# Patient Record
Sex: Female | Born: 1995 | Race: Black or African American | Hispanic: No | Marital: Single | Smoking: Former smoker
Health system: Southern US, Community
[De-identification: ages and names within clinical notes are randomized; demographics above are authoritative.]

---
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
## 2015-05-25 ENCOUNTER — Encounter (HOSPITAL_COMMUNITY): Payer: Self-pay | Admitting: *Deleted

## 2015-05-25 ENCOUNTER — Emergency Department (HOSPITAL_COMMUNITY)
Admission: EM | Admit: 2015-05-25 | Discharge: 2015-05-25 | Disposition: A | Discharge status: Home / Self Care | Payer: Medicaid Other | Attending: Emergency Medicine | Admitting: Emergency Medicine

## 2015-05-25 DIAGNOSIS — R112 Nausea with vomiting, unspecified: Secondary | ICD-10-CM

## 2015-05-25 DIAGNOSIS — Z3202 Encounter for pregnancy test, result negative: Secondary | ICD-10-CM | POA: Insufficient documentation

## 2015-05-25 DIAGNOSIS — R1012 Left upper quadrant pain: Secondary | ICD-10-CM | POA: Diagnosis not present

## 2015-05-25 LAB — I-STAT CHEM 8, ED
BUN: 14 mg/dL (ref 6–20)
CHLORIDE: 99 mmol/L — AB (ref 101–111)
CREATININE: 0.7 mg/dL (ref 0.44–1.00)
Calcium, Ion: 1.19 mmol/L (ref 1.12–1.23)
GLUCOSE: 101 mg/dL — AB (ref 65–99)
HCT: 42 % (ref 36.0–46.0)
Hemoglobin: 14.3 g/dL (ref 12.0–15.0)
POTASSIUM: 4.3 mmol/L (ref 3.5–5.1)
Sodium: 137 mmol/L (ref 135–145)
TCO2: 27 mmol/L (ref 0–100)

## 2015-05-25 LAB — I-STAT BETA HCG BLOOD, ED (MC, WL, AP ONLY)

## 2015-05-25 MED ORDER — ONDANSETRON HCL 8 MG PO TABS: 8.0000 mg | ORAL_TABLET | Freq: Three times a day (TID) | ORAL | Status: DC | PRN

## 2015-05-25 MED ORDER — SODIUM CHLORIDE 0.9 % IV SOLN: INTRAVENOUS | Status: DC

## 2015-05-25 MED ORDER — SODIUM CHLORIDE 0.9 % IV BOLUS (SEPSIS)
1000.0000 mL | Freq: Once | INTRAVENOUS | Status: AC
  Administered 2015-05-25: 1000 mL via INTRAVENOUS

## 2015-05-25 NOTE — ED Notes (Signed)
Patient given crackers and sprite  

## 2015-05-25 NOTE — ED Provider Notes (Signed)
CSN: [id number]     Arrival date & time 05/25/15  1004 History   First MD Initiated Contact with Patient 05/25/15 1011     Chief Complaint  Patient presents with  . Emesis     (Consider location/radiation/quality/duration/timing/severity/associated sxs/prior Treatment) HPI   Brittany Norton is a 19 y.o. female who presents for evaluation of nausea and vomiting which started yesterday. No hematemesis. No diarrhea. Last menstrual period one week ago. No nausea, contacts, or abnormal food ingestions. There are no other known modifying factors.    History reviewed. No pertinent past medical history. History reviewed. No pertinent past surgical history. Family History  Problem Relation Age of Onset  . Anxiety disorder Mother   . Depression Mother   . Diabetes Mother   . Hypertension Mother   . Hypertension Father    Social History  Substance Use Topics  . Smoking status: Never Smoker   . Smokeless tobacco: None  . Alcohol Use: No   OB History    No data available     Review of Systems  All other systems reviewed and are negative.     Allergies  Peanut-containing drug products  Home Medications   Prior to Admission medications   Medication Sig Start Date End Date Taking? Authorizing Provider  ondansetron (ZOFRAN) 8 MG tablet Take 1 tablet (8 mg total) by mouth every 8 (eight) hours as needed for nausea or vomiting. 05/25/15   Mancel Bale, MD   BP 115/68 mmHg  Pulse 97  Temp(Src) 99.6 F (37.6 C) (Oral)  Resp 18  SpO2 100%  LMP 05/17/2015 Physical Exam  Constitutional: She is oriented to person, place, and time. She appears well-developed and well-nourished. No distress.  HENT:  Head: Normocephalic and atraumatic.  Right Ear: External ear normal.  Left Ear: External ear normal.  Eyes: Conjunctivae and EOM are normal. Pupils are equal, round, and reactive to light.  Neck: Normal range of motion and phonation normal. Neck supple.  Cardiovascular: Normal rate,  regular rhythm and normal heart sounds.   Pulmonary/Chest: Effort normal and breath sounds normal. She exhibits no bony tenderness.  Abdominal: Soft. There is tenderness (Left upper quadrant, mild). There is no rebound and no guarding.  Musculoskeletal: Normal range of motion.  Neurological: She is alert and oriented to person, place, and time. No cranial nerve deficit or sensory deficit. She exhibits normal muscle tone. Coordination normal.  Skin: Skin is warm, dry and intact.  Psychiatric: She has a normal mood and affect. Her behavior is normal. Judgment and thought content normal.  Nursing note and vitals reviewed.   ED Course  Procedures (including critical care time)  Medications  0.9 %  sodium chloride infusion (not administered)  sodium chloride 0.9 % bolus 1,000 mL (0 mLs Intravenous Stopped 05/25/15 1153)    Patient Vitals for the past 24 hrs:  BP Temp Temp src Pulse Resp SpO2  05/25/15 1145 115/68 mmHg - - 97 18 100 %  05/25/15 1008 118/79 mmHg 99.6 F (37.6 C) Oral (!) 122 18 97 %    12:36 PM Reevaluation with update and discussion. After initial assessment and treatment, an updated evaluation reveals she feels better. No vomiting in the emergency department. Findings discussed with patient and mother, all questions were answered. Brittany Norton    Labs Review Labs Reviewed  I-STAT CHEM 8, ED - Abnormal; Notable for the following:    Chloride 99 (*)    Glucose, Bld 101 (*)    All other components  within normal limits  I-STAT BETA HCG BLOOD, ED ([facility], [facility], [facility] ONLY)    Imaging Review No results found. I have personally reviewed and evaluated these images and lab results as part of my medical decision-making.   EKG Interpretation None      MDM   Final diagnoses:  Nausea and vomiting, vomiting of unspecified type    Nonspecific, nausea and vomiting. Differential diagnosis includes viral illness, and food poisoning. Doubt serous but you'll infection, metabolic  instability or impending vascular collapse.  Nursing Notes Reviewed/ Care Coordinated Applicable Imaging Reviewed Interpretation of Laboratory Data incorporated into ED treatment  The patient appears reasonably screened and/or stabilized for discharge and I doubt any other medical condition or other [facility] requiring further screening, evaluation, or treatment in the ED at this time prior to discharge.  Plan: Home Medications- Zofran; Home Treatments- rest, clear liquids and advance slowly; return here if the recommended treatment, does not improve the symptoms; Recommended follow up- PCP prn     Mancel Bale, MD 05/25/15 1237

## 2015-05-25 NOTE — ED Notes (Signed)
Patient tolerating po's without n&v.

## 2015-05-25 NOTE — Discharge Instructions (Signed)
Nausea and Vomiting °Nausea is a sick feeling that often comes before throwing up (vomiting). Vomiting is a reflex where stomach contents come out of your mouth. Vomiting can cause severe loss of body fluids (dehydration). Children and elderly adults can become dehydrated quickly, especially if they also have diarrhea. Nausea and vomiting are symptoms of a condition or disease. It is important to find the cause of your symptoms. °CAUSES  °· Direct irritation of the stomach lining. This irritation can result from increased acid production (gastroesophageal reflux disease), infection, food poisoning, taking certain medicines (such as nonsteroidal anti-inflammatory drugs), alcohol use, or tobacco use. °· Signals from the brain. These signals could be caused by a headache, heat exposure, an inner ear disturbance, increased pressure in the brain from injury, infection, a tumor, or a concussion, pain, emotional stimulus, or metabolic problems. °· An obstruction in the gastrointestinal tract (bowel obstruction). °· Illnesses such as diabetes, hepatitis, gallbladder problems, appendicitis, kidney problems, cancer, sepsis, atypical symptoms of a heart attack, or eating disorders. °· Medical treatments such as chemotherapy and radiation. °· Receiving medicine that makes you sleep (general anesthetic) during surgery. °DIAGNOSIS °Your caregiver may ask for tests to be done if the problems do not improve after a few days. Tests may also be done if symptoms are severe or if the reason for the nausea and vomiting is not clear. Tests may include: °· Urine tests. °· Blood tests. °· Stool tests. °· Cultures (to look for evidence of infection). °· X-rays or other imaging studies. °Test results can help your caregiver make decisions about treatment or the need for additional tests. °TREATMENT °You need to stay well hydrated. Drink frequently but in small amounts. You may wish to drink water, sports drinks, clear broth, or eat frozen  ice pops or gelatin dessert to help stay hydrated. When you eat, eating slowly may help prevent nausea. There are also some antinausea medicines that may help prevent nausea. °HOME CARE INSTRUCTIONS  °· Take all medicine as directed by your caregiver. °· If you do not have an appetite, do not force yourself to eat. However, you must continue to drink fluids. °· If you have an appetite, eat a normal diet unless your caregiver tells you differently. °¨ Eat a variety of complex carbohydrates (rice, wheat, potatoes, bread), lean meats, yogurt, fruits, and vegetables. °¨ Avoid high-fat foods because they are more difficult to digest. °· Drink enough water and fluids to keep your urine clear or pale yellow. °· If you are dehydrated, ask your caregiver for specific rehydration instructions. Signs of dehydration may include: °¨ Severe thirst. °¨ Dry lips and mouth. °¨ Dizziness. °¨ Dark urine. °¨ Decreasing urine frequency and amount. °¨ Confusion. °¨ Rapid breathing or pulse. °SEEK IMMEDIATE MEDICAL CARE IF:  °· You have blood or brown flecks (like coffee grounds) in your vomit. °· You have black or bloody stools. °· You have a severe headache or stiff neck. °· You are confused. °· You have severe abdominal pain. °· You have chest pain or trouble breathing. °· You do not urinate at least once every 8 hours. °· You develop cold or clammy skin. °· You continue to vomit for longer than 24 to 48 hours. °· You have a fever. °MAKE SURE YOU:  °· Understand these instructions. °· Will watch your condition. °· Will get help right away if you are not doing well or get worse. °  °This information is not intended to replace advice given to you by your health care provider. Make sure   you discuss any questions you have with your health care provider.   Document Released: 08/02/2005 Document Revised: 10/25/2011 Document Reviewed: 12/30/2010 Elsevier Interactive Patient Education 2016 Elsevier Inc. Clear Liquid Diet A clear liquid  diet is a short-term diet that is prescribed to provide the necessary fluid and basic energy you need when you can have nothing else. The clear liquid diet consists of liquids or solids that will become liquid at room temperature. You should be able to see through the liquid. There are many reasons that you may be restricted to clear liquids, such as:  When you have a sudden-onset (acute) condition that occurs before or after surgery.  To help your body slowly get adjusted to food again after a long period when you were unable to have food.  Replacement of fluids when you have a diarrheal disease.  When you are going to have certain exams, such as a colonoscopy, in which instruments are inserted inside your body to look at parts of your digestive system. WHAT CAN I HAVE? A clear liquid diet does not provide all the nutrients you need. It is important to choose a variety of the following items to get as many nutrients as possible:  Vegetable juices that do not have pulp.  Fruit juices and fruit drinks that do not have pulp.  Coffee (regular or decaffeinated), tea, or soda at the discretion of your health care provider.  Clear bouillon, broth, or strained broth-based soups.  High-protein and flavored gelatins.  Sugar or honey.  Ices or frozen ice pops that do not contain milk. If you are not sure whether you can have certain items, you should ask your health care provider. You may also ask your health care provider if there are any other clear liquid options.   This information is not intended to replace advice given to you by your health care provider. Make sure you discuss any questions you have with your health care provider.   Document Released: 08/02/2005 Document Revised: 08/07/2013 Document Reviewed: 06/29/2013 Elsevier Interactive Patient Education 2016 [employer]. Food Choices to Help Relieve Diarrhea, Adult When you have diarrhea, the foods you eat and your eating habits  are very important. Choosing the right foods and drinks can help relieve diarrhea. Also, because diarrhea can last up to 7 days, you need to replace lost fluids and electrolytes (such as sodium, potassium, and chloride) in order to help prevent dehydration.  WHAT GENERAL GUIDELINES DO I NEED TO FOLLOW?  Slowly drink 1 cup (8 oz) of fluid for each episode of diarrhea. If you are getting enough fluid, your urine will be clear or pale yellow.  Eat starchy foods. Some good choices include white rice, white toast, pasta, low-fiber cereal, baked potatoes (without the skin), saltine crackers, and bagels.  Avoid large servings of any cooked vegetables.  Limit fruit to two servings per day. A serving is  cup or 1 small piece.  Choose foods with less than 2 g of fiber per serving.  Limit fats to less than 8 tsp (38 g) per day.  Avoid fried foods.  Eat foods that have probiotics in them. Probiotics can be found in certain dairy products.  Avoid foods and beverages that may increase the speed at which food moves through the stomach and intestines (gastrointestinal tract). Things to avoid include:  High-fiber foods, such as dried fruit, raw fruits and vegetables, nuts, seeds, and whole grain foods.  Spicy foods and high-fat foods.  Foods and beverages sweetened with  high-fructose corn syrup, honey, or sugar alcohols such as xylitol, sorbitol, and mannitol. WHAT FOODS ARE RECOMMENDED? Grains White rice. White, [place], or pita breads (fresh or toasted), including plain rolls, buns, or bagels. White pasta. Saltine, soda, or graham crackers. Pretzels. Low-fiber cereal. Cooked cereals made with water (such as cornmeal, farina, or cream cereals). Plain muffins. Matzo. Melba toast. Zwieback.  Vegetables Potatoes (without the skin). Strained tomato and vegetable juices. Most well-cooked and canned vegetables without seeds. Tender lettuce. Fruits Cooked or canned applesauce, apricots, cherries, fruit  cocktail, grapefruit, peaches, pears, or plums. Fresh bananas, apples without skin, cherries, grapes, cantaloupe, grapefruit, peaches, oranges, or plums.  Meat and Other Protein Products Baked or boiled chicken. Eggs. Tofu. Fish. Seafood. Smooth peanut butter. Ground or well-cooked tender beef, ham, veal, lamb, pork, or poultry.  Dairy Plain yogurt, kefir, and unsweetened liquid yogurt. Lactose-free milk, buttermilk, or soy milk. Plain hard cheese. Beverages Sport drinks. Clear broths. Diluted fruit juices (except prune). Regular, caffeine-free sodas such as ginger ale. Water. Decaffeinated teas. Oral rehydration solutions. Sugar-free beverages not sweetened with sugar alcohols. Other Bouillon, broth, or soups made from recommended foods.  The items listed above may not be a complete list of recommended foods or beverages. Contact your dietitian for more options. WHAT FOODS ARE NOT RECOMMENDED? Grains Whole grain, whole wheat, bran, or rye breads, rolls, pastas, crackers, and cereals. Wild or brown rice. Cereals that contain more than 2 g of fiber per serving. Corn tortillas or taco shells. Cooked or dry oatmeal. Granola. Popcorn. Vegetables Raw vegetables. Cabbage, broccoli, Brussels sprouts, artichokes, baked beans, beet greens, corn, kale, legumes, peas, sweet potatoes, and yams. Potato skins. Cooked spinach and cabbage. Fruits Dried fruit, including raisins and dates. Raw fruits. Stewed or dried prunes. Fresh apples with skin, apricots, mangoes, pears, raspberries, and strawberries.  Meat and Other Protein Products Chunky peanut butter. Nuts and seeds. Beans and lentils. Tomasa Blase.  Dairy High-fat cheeses. Milk, chocolate milk, and beverages made with milk, such as milk shakes. Cream. Ice cream. Sweets and Desserts Sweet rolls, doughnuts, and sweet breads. Pancakes and waffles. Fats and Oils Butter. Cream sauces. Margarine. Salad oils. Plain salad dressings. Olives. Avocados.   Beverages Caffeinated beverages (such as coffee, tea, soda, or energy drinks). Alcoholic beverages. Fruit juices with pulp. Prune juice. Soft drinks sweetened with high-fructose corn syrup or sugar alcohols. Other Coconut. Hot sauce. Chili powder. Mayonnaise. Gravy. Cream-based or milk-based soups.  The items listed above may not be a complete list of foods and beverages to avoid. Contact your dietitian for more information. WHAT SHOULD I DO IF I BECOME DEHYDRATED? Diarrhea can sometimes lead to dehydration. Signs of dehydration include dark urine and dry mouth and skin. If you think you are dehydrated, you should rehydrate with an oral rehydration solution. These solutions can be purchased at pharmacies, retail stores, or online.  Drink -1 cup (120-240 mL) of oral rehydration solution each time you have an episode of diarrhea. If drinking this amount makes your diarrhea worse, try drinking smaller amounts more often. For example, drink 1-3 tsp (5-15 mL) every 5-10 minutes.  A general rule for staying hydrated is to drink 1-2 L of fluid per day. Talk to your health care provider about the specific amount you should be drinking each day. Drink enough fluids to keep your urine clear or pale yellow.   This information is not intended to replace advice given to you by your health care provider. Make sure you discuss any questions you have with  your health care provider.   Document Released: 10/23/2003 Document Revised: 08/23/2014 Document Reviewed: 06/25/2013 Elsevier Interactive Patient Education [employer]

## 2015-05-25 NOTE — ED Notes (Signed)
Pt began having emesis yesterday.  Has had emesis 6-7x per mom.  She possibly has had a mild fever and does have a non-productive cough, but denies  Any pain, headache, sore throat or any other symptoms.  Denies diarrhea. LBM 05/25/2015 - normal; no c/o urinary symptoms.  Cannot keep po fluids down per Mother.

## 2015-05-25 NOTE — ED Notes (Signed)
MD at bedside. Dr. Wentz. 

## 2015-11-12 ENCOUNTER — Ambulatory Visit (INDEPENDENT_AMBULATORY_CARE_PROVIDER_SITE_OTHER): Payer: Medicaid Other | Admitting: Certified Nurse Midwife

## 2015-11-12 ENCOUNTER — Other Ambulatory Visit (HOSPITAL_COMMUNITY)
Admission: RE | Admit: 2015-11-12 | Discharge: 2015-11-12 | Disposition: A | Discharge status: Home / Self Care | Payer: Medicaid Other | Source: Ambulatory Visit | Attending: Certified Nurse Midwife | Admitting: Certified Nurse Midwife

## 2015-11-12 ENCOUNTER — Encounter: Payer: Self-pay | Admitting: Certified Nurse Midwife

## 2015-11-12 VITALS — BP 105/70 | HR 81 | Ht 66.5 in | Wt 174.0 lb

## 2015-11-12 DIAGNOSIS — N898 Other specified noninflammatory disorders of vagina: Secondary | ICD-10-CM | POA: Insufficient documentation

## 2015-11-12 DIAGNOSIS — Z01419 Encounter for gynecological examination (general) (routine) without abnormal findings: Secondary | ICD-10-CM

## 2015-11-12 DIAGNOSIS — Z7251 High risk heterosexual behavior: Secondary | ICD-10-CM | POA: Insufficient documentation

## 2015-11-12 DIAGNOSIS — Z202 Contact with and (suspected) exposure to infections with a predominantly sexual mode of transmission: Secondary | ICD-10-CM | POA: Diagnosis not present

## 2015-11-12 DIAGNOSIS — Z113 Encounter for screening for infections with a predominantly sexual mode of transmission: Secondary | ICD-10-CM

## 2015-11-12 NOTE — Progress Notes (Signed)
Patient ID: Brittany Norton Luevanos, female   DOB: 12/15/1995, 20 y.o.   MRN: [id number] History of Present Illness   Patient Identification Brittany Norton Sorg is a 20 y.o. female.  Patient information was obtained from patient. History/Exam limitations: none.   Chief Complaint  Gynecologic Exam Wants STD testing  Patient presents with a complaint of vaginal discharge.The patient describes the discharge as white and does not experience abdominal discomfort with the discharge. She does not complain of burning with urination. She states her sexual partner was diagnosed with Chlamydia last week. She denies genital lesions at this time. The patient does not  have a history of STD's or PID and denies multiple sexual partners. She is homosexual. The patient has not had HIV testing. She is No obstetric history on file.   History reviewed. No pertinent past medical history. Family History  Problem Relation Age of Onset  . Anxiety disorder Mother   . Depression Mother   . Diabetes Mother   . Hypertension Mother   . Hypertension Father   . Diabetes Maternal Grandmother    Scheduled Meds: Continuous Infusions: PRN Meds:  Allergies  Allergen Reactions  . Peanut-Containing Drug Products Itching    Itching all over    Social History   Social History  . Marital Status: Single    Spouse Name: N/A  . Number of Children: N/A  . Years of Education: N/A   Occupational History  . Not on file.   Social History Main Topics  . Smoking status: Current Some Day Smoker -- 2 years    Types: Cigarettes  . Smokeless tobacco: Never Used  . Alcohol Use: Yes     Comment: social  . Drug Use: No  . Sexual Activity: Yes    Birth Control/ Protection: None     Comment: same sex partner   Other Topics Concern  . Not on file   Social History Narrative   Review of Systems Pertinent items are noted in HPI.   Physical Exam   BP 105/70 mmHg  Pulse 81  Ht 5' 6.5" (1.689 m)  Wt 174 lb (78.926 kg)  BMI  27.67 kg/m2  LMP 11/05/2015 General:   alert and cooperative  Heart: normal apical impulse  Lungs: clear to auscultation bilaterally  Abdomen: soft, non-tender, without masses or organomegaly  Pelvic:    Vulva: normal   Vagina:  wet prep done, gc and ch    Cervix: refused   Uterus: refused   Adnexa: refused      Studies: Lab: STD testing  Pt refused pelvic exam.   Exposure STD High Risk sexual behavior  STD testing; Will treat appropriately when results are in  [facility]ori Clemmons CNM

## 2015-11-12 NOTE — Progress Notes (Signed)
Here today for gyn exam. Does have vaginal discharge, without odor, some itching.  Wants full STI screening.

## 2015-11-12 NOTE — Patient Instructions (Signed)
Chlamydia, Female °Chlamydia is an infection. It is spread from one person to another person during sexual contact. This infection can be in the cervix, urine tube (urethra), throat, or bottom (rectum). This infection needs treatment. °HOME CARE  °· Take your medicines (antibiotics) as told. Finish them even if you start to feel better. °· Only take medicine as told by your doctor. °· Tell your sex partner(s) that you have chlamydia. They must also be treated. °· Do not have sex until your doctor says it is okay. °· Rest. °· Eat healthy. Drink enough fluids to keep your pee (urine) clear or pale yellow. °· Keep all doctor visits as told. °GET HELP IF: °· You have pain when you pee. °· You have belly pain. °· You have vaginal discharge. °· You have pain during sex. °· You have bleeding between periods and after sex. °· You have a fever. °GET HELP RIGHT AWAY IF:  °· You feel sick to your stomach (nauseous) or you throw up (vomit). °· You sweat much more than normal (diaphoresis). °· You have trouble swallowing. °  °This information is not intended to replace advice given to you by your health care provider. Make sure you discuss any questions you have with your health care provider. °  °Document Released: 05/11/2008 Document Revised: 04/23/2015 Document Reviewed: 04/09/2013 °Elsevier Interactive Patient Education ©2016 Elsevier Inc. ° °

## 2015-11-13 LAB — GC/CHLAMYDIA PROBE AMP (~~LOC~~) NOT AT ARMC
Chlamydia: NEGATIVE
Neisseria Gonorrhea: NEGATIVE

## 2015-11-13 LAB — RPR

## 2015-11-13 LAB — HEPATITIS B SURFACE ANTIGEN: Hepatitis B Surface Ag: NEGATIVE

## 2015-11-13 LAB — HEPATITIS C ANTIBODY: HCV Ab: NEGATIVE

## 2015-11-13 LAB — WET PREP, GENITAL
Trich, Wet Prep: NONE SEEN
Yeast Wet Prep HPF POC: NONE SEEN

## 2015-11-13 LAB — HIV ANTIBODY (ROUTINE TESTING W REFLEX): HIV 1&2 Ab, 4th Generation: NONREACTIVE

## 2015-11-21 ENCOUNTER — Telehealth: Payer: Self-pay | Admitting: *Deleted

## 2015-11-21 DIAGNOSIS — B9689 Other specified bacterial agents as the cause of diseases classified elsewhere: Secondary | ICD-10-CM

## 2015-11-21 DIAGNOSIS — N760 Acute vaginitis: Principal | ICD-10-CM

## 2015-11-21 MED ORDER — METRONIDAZOLE 500 MG PO TABS: 500.0000 mg | ORAL_TABLET | Freq: Two times a day (BID) | ORAL | Status: DC

## 2015-11-21 NOTE — Telephone Encounter (Signed)
Called pt to adv few clue cells seen on wet prep and all other labs are normal. Sent Flagyl to pt's preferred pharm

## 2018-08-24 ENCOUNTER — Encounter (HOSPITAL_COMMUNITY): Payer: Self-pay

## 2018-08-24 ENCOUNTER — Emergency Department (HOSPITAL_COMMUNITY)
Admission: EM | Admit: 2018-08-24 | Discharge: 2018-08-24 | Disposition: A | Discharge status: Home / Self Care | Attending: Emergency Medicine | Admitting: Emergency Medicine

## 2018-08-24 ENCOUNTER — Other Ambulatory Visit: Payer: Self-pay

## 2018-08-24 DIAGNOSIS — R69 Illness, unspecified: Secondary | ICD-10-CM

## 2018-08-24 DIAGNOSIS — J111 Influenza due to unidentified influenza virus with other respiratory manifestations: Secondary | ICD-10-CM | POA: Diagnosis not present

## 2018-08-24 DIAGNOSIS — Z91010 Allergy to peanuts: Secondary | ICD-10-CM | POA: Diagnosis not present

## 2018-08-24 DIAGNOSIS — Z87891 Personal history of nicotine dependence: Secondary | ICD-10-CM | POA: Insufficient documentation

## 2018-08-24 DIAGNOSIS — R0981 Nasal congestion: Secondary | ICD-10-CM | POA: Diagnosis present

## 2018-08-24 MED ORDER — FLUTICASONE PROPIONATE 50 MCG/ACT NA SUSP: 1.0000 | Freq: Every day | NASAL | 2 refills | Status: AC

## 2018-08-24 MED ORDER — BENZONATATE 100 MG PO CAPS: 100.0000 mg | ORAL_CAPSULE | Freq: Three times a day (TID) | ORAL | 0 refills | Status: DC

## 2018-08-24 NOTE — Discharge Instructions (Signed)
You can take Tylenol or Ibuprofen as directed for pain. You can alternate Tylenol and Ibuprofen every 4 hours. If you take Tylenol at 1pm, then you can take Ibuprofen at 5pm. Then you can take Tylenol again at 9pm.   Make sure you drink plenty of fluids and staying hydrated.  Make sure you are getting plenty of rest.  Use Flonase as directed for nasal congestion.  Use Tessalon Perles for cough.  Return emergency department for high fever despite Tylenol ibuprofen, difficulty breathing, vomiting or any other worsening or concerning symptoms.

## 2018-08-24 NOTE — ED Notes (Signed)
Bed: WTR6 Expected date:  Expected time:  Means of arrival:  Comments: 

## 2018-08-24 NOTE — ED Provider Notes (Signed)
[facility]-EMERGENCY DEPT Provider Note   CSN: [id number] Arrival date & time: 08/24/18  1122     History   Chief Complaint Chief Complaint  Patient presents with  . flu symptoms    HPI Brittany Norton is a 23 y.o. female who presents for evaluation of 4 days of nasal congestion, sore throat, coughing, generalized body aches, sneezing.  Patient reports that congestion has been getting worse over last several days.  Patient also reports some bilateral ear pain and headache.  Patient states she has not been taking medications for her symptoms.  Patient reports cough is productive of green phlegm.  Patient denies any history of asthma.  Patient reports she has been able tolerate her secretions and tolerate p.o. without difficulty though she does report she has had some decreased appetite.  Patient states she has not had any abdominal pain, diarrhea, vomiting.  Patient has not been running fevers.  Patient denies any smoking or vaping.  The history is provided by the patient.    History reviewed. No pertinent past medical history.  Patient Active Problem List   Diagnosis Date Noted  . High risk sexual behavior 11/12/2015  . Possible exposure to STD 11/12/2015  . Vaginal discharge 11/12/2015    History reviewed. No pertinent surgical history.   OB History   No obstetric history on file.      Home Medications    Prior to Admission medications   Medication Sig Start Date End Date Taking? Authorizing Provider  benzonatate (TESSALON) 100 MG capsule Take 1 capsule (100 mg total) by mouth every 8 (eight) hours. 08/24/18   Maxwell CaulLayden, Henry Demeritt A, PA-C  fluticasone (FLONASE) 50 MCG/ACT nasal spray Place 1 spray into both nostrils daily. 08/24/18   Maxwell CaulLayden, Esthela Brandner A, PA-C  ibuprofen (ADVIL,MOTRIN) 800 MG tablet Take 800 mg by mouth every 8 (eight) hours as needed.    [provider]  metroNIDAZOLE (FLAGYL) 500 MG tablet Take 1 tablet (500 mg total) by mouth 2 (two)  times daily. 11/21/15   Tereso NewcomerAnyanwu, Ugonna A, MD    Family History Family History  Problem Relation Age of Onset  . Anxiety disorder Mother   . Depression Mother   . Diabetes Mother   . Hypertension Mother   . Hypertension Father   . Diabetes Maternal Grandmother     Social History Social History   Tobacco Use  . Smoking status: Former Smoker    Years: 2.00    Types: Cigarettes  . Smokeless tobacco: Never Used  Substance Use Topics  . Alcohol use: Yes    Comment: social  . Drug use: No     Allergies   Peanut-containing drug products   Review of Systems Review of Systems  Constitutional: Negative for fever.  HENT: Positive for congestion, ear pain, rhinorrhea and sore throat. Negative for drooling and trouble swallowing.   Respiratory: Positive for cough. Negative for shortness of breath.   Cardiovascular: Negative for chest pain.  Gastrointestinal: Negative for abdominal pain, nausea and vomiting.  Neurological: Positive for headaches.  All other systems reviewed and are negative.    Physical Exam Updated Vital Signs BP 138/77 (BP Location: Right Arm)   Pulse 73   Temp 98.2 F (36.8 C) (Oral)   Resp 16   Ht 5\' 6"  (1.676 m)   Wt 79.4 kg   LMP 08/17/2018   SpO2 92%   BMI 28.25 kg/m   Physical Exam Vitals signs and nursing note reviewed.  Constitutional:  Appearance: She is well-developed.  HENT:     Head: Normocephalic and atraumatic.     Right Ear: Tympanic membrane is erythematous.     Left Ear: Tympanic membrane normal.     Nose: Congestion present.     Comments: Edematous and erythematous nasal turbinates bilaterally.    Mouth/Throat:     Mouth: Mucous membranes are moist.     Pharynx: Uvula midline. Posterior oropharyngeal erythema present. No oropharyngeal exudate.     Comments: Airways pain, phonation is intact.  Posterior oropharynx with slight erythema.  No edema, exudates.  Uvula is midline.  No trismus. Eyes:     General: No scleral  icterus.       Right eye: No discharge.        Left eye: No discharge.     Conjunctiva/sclera: Conjunctivae normal.  Pulmonary:     Effort: Pulmonary effort is normal.     Breath sounds: Normal breath sounds.     Comments: Lungs clear to auscultation bilaterally.  Symmetric chest rise.  No wheezing, rales, rhonchi. Skin:    General: Skin is warm and dry.  Neurological:     Mental Status: She is alert.  Psychiatric:        Speech: Speech normal.        Behavior: Behavior normal.     ED Treatments / Results  Labs (all labs ordered are listed, but only abnormal results are displayed) Labs Reviewed - No data to display  EKG None  Radiology No results found.  Procedures Procedures (including critical care time)  Medications Ordered in ED Medications - No data to display   Initial Impression / Assessment and Plan / ED Course  I have reviewed the triage vital signs and the nursing notes.  Pertinent labs & imaging results that were available during my care of the patient were reviewed by me and considered in my medical decision making (see chart for details).     23 year old female who presents for evaluation of 4 days of nasal congestion, cough, rhinorrhea, generalized body aches, headache, ear pain.  No fevers noted at home.  No difficulty eating or drinking. Patient is afebrile, non-toxic appearing, sitting comfortably on examination table. Vital signs reviewed and stable.  Lungs clear to auscultation exam.  Consider viral URI versus influenza.  History/physical exam not concerning for peritonsillar abscess, Ludwig angina, pneumonia.  Discussed with patient that since she is 4 days out of her symptoms, she is not a candidate for Tamiflu this is influenza.  Encourage at home supportive care measures. At this time, patient exhibits no emergent life-threatening condition that require further evaluation in ED or admission. Patient had ample opportunity for questions and discussion.  All patient's questions were answered with full understanding. Strict return precautions discussed. Patient expresses understanding and agreement to plan.   Portions of this note were generated with Scientist, clinical (histocompatibility and immunogenetics)Dragon dictation software. Dictation errors may occur despite best attempts at proofreading.  Final Clinical Impressions(s) / ED Diagnoses   Final diagnoses:  Influenza-like illness    ED Discharge Orders         Ordered    fluticasone (FLONASE) 50 MCG/ACT nasal spray  Daily     08/24/18 1313    benzonatate (TESSALON) 100 MG capsule  Every 8 hours     08/24/18 1313           Maxwell CaulLayden, Talicia Sui A, PA-C 08/24/18 1335    Virgina Norfolkuratolo, Adam, DO 08/24/18 1910

## 2018-08-24 NOTE — ED Triage Notes (Signed)
Patient c/o a productive cough with yellow sputum, sore throat, nasal congestion, body aches, feeling weak x 4 days.

## 2019-05-14 ENCOUNTER — Ambulatory Visit (HOSPITAL_COMMUNITY)
Admission: EM | Admit: 2019-05-14 | Discharge: 2019-05-14 | Disposition: A | Discharge status: Home / Self Care | Attending: Emergency Medicine | Admitting: Emergency Medicine

## 2019-05-14 ENCOUNTER — Other Ambulatory Visit: Payer: Self-pay

## 2019-05-14 DIAGNOSIS — M25561 Pain in right knee: Secondary | ICD-10-CM

## 2019-05-14 MED ORDER — NAPROXEN 500 MG PO TABS: 500.0000 mg | ORAL_TABLET | Freq: Two times a day (BID) | ORAL | 0 refills | Status: AC

## 2019-05-14 NOTE — ED Triage Notes (Signed)
Pt presents with right knee pain related to work exercises; pt is in [employer] and says they didn't have time to prepare for the physical exercises and she thinks the weight back she had to carry was too much.

## 2019-05-14 NOTE — ED Provider Notes (Signed)
[place]    CSN: [id number] Arrival date & time: 05/14/19  0093      History   Chief Complaint Chief Complaint  Patient presents with  . Appointment  . (9:30) Knee Injury    HPI Brittany Norton is a 23 y.o. female no contributing past medical history presenting today for evaluation of right knee pain.  Patient states that over the past week she has had pain and some swelling to her right knee.  States that prior to this she had a lot of physical therapy/exercises while at work.  She works for [employer].  States that she was doing a lot of exercise with weight on her back.  She believes she fell, but denies any specific inciting incident.  Denies any direct blow or trauma to the knee.  Denies sensation of instability.  Has had some recurrent pain in this right knee previously, but no specific injury.  HPI  No past medical history on file.  Patient Active Problem List   Diagnosis Date Noted  . High risk sexual behavior 11/12/2015  . Possible exposure to STD 11/12/2015  . Vaginal discharge 11/12/2015    No past surgical history on file.  OB History   No obstetric history on file.      Home Medications    Prior to Admission medications   Medication Sig Start Date End Date Taking? Authorizing Provider  fluticasone (FLONASE) 50 MCG/ACT nasal spray Place 1 spray into both nostrils daily. 08/24/18   Volanda Napoleon, PA-C  ibuprofen (ADVIL,MOTRIN) 800 MG tablet Take 800 mg by mouth every 8 (eight) hours as needed.    [provider]  naproxen (NAPROSYN) 500 MG tablet Take 1 tablet (500 mg total) by mouth 2 (two) times daily. 05/14/19   Evvie Behrmann, Elesa Hacker, PA-C    Family History Family History  Problem Relation Age of Onset  . Anxiety disorder Mother   . Depression Mother   . Diabetes Mother   . Hypertension Mother   . Hypertension Father   . Diabetes Maternal Grandmother     Social History Social History   Tobacco Use  . Smoking status: Former  Smoker    Years: 2.00    Types: Cigarettes  . Smokeless tobacco: Never Used  Substance Use Topics  . Alcohol use: Yes    Comment: social  . Drug use: No     Allergies   Peanut-containing drug products   Review of Systems Review of Systems  Constitutional: Negative for fatigue and fever.  Eyes: Negative for visual disturbance.  Respiratory: Negative for shortness of breath.   Cardiovascular: Negative for chest pain.  Gastrointestinal: Negative for abdominal pain, nausea and vomiting.  Musculoskeletal: Positive for gait problem and joint swelling. Negative for arthralgias.  Skin: Negative for color change, rash and wound.  Neurological: Negative for dizziness, weakness, light-headedness and headaches.     Physical Exam Triage Vital Signs ED Triage Vitals  Enc Vitals Group     BP 05/14/19 0935 133/86     Pulse Rate 05/14/19 0935 68     Resp 05/14/19 0935 16     Temp 05/14/19 0935 98.3 F (36.8 C)     Temp Source 05/14/19 0935 Temporal     SpO2 05/14/19 0935 100 %     Weight --      Height --      Head Circumference --      Peak Flow --      Pain Score 05/14/19  0945 7     Pain Loc --      Pain Edu? --      Excl. in [place]? --    No data found.  Updated Vital Signs BP 133/86 (BP Location: Left Arm)   Pulse 68   Temp 98.3 F (36.8 C) (Temporal)   Resp 16   LMP 04/13/2019   SpO2 100%   Visual Acuity Right Eye Distance:   Left Eye Distance:   Bilateral Distance:    Right Eye Near:   Left Eye Near:    Bilateral Near:     Physical Exam Vitals signs and nursing note reviewed.  Constitutional:      Appearance: She is well-developed.     Comments: No acute distress  HENT:     Head: Normocephalic and atraumatic.     Nose: Nose normal.  Eyes:     Conjunctiva/sclera: Conjunctivae normal.  Neck:     Musculoskeletal: Neck supple.  Cardiovascular:     Rate and Rhythm: Normal rate.  Pulmonary:     Effort: Pulmonary effort is normal. No respiratory distress.   Abdominal:     General: There is no distension.  Musculoskeletal: Normal range of motion.     Comments: Right knee: No obvious swelling or deformity, tender to touch over patella and suprapatellar area, nontender left her medial lateral joint line, patella full active flexion and extension of knee, no laxity appreciated with varus and valgus stress, negative Lachman, negative McMurray Gait without antalgia    Skin:    General: Skin is warm and dry.  Neurological:     Mental Status: She is alert and oriented to person, place, and time.      UC Treatments / Results  Labs (all labs ordered are listed, but only abnormal results are displayed) Labs Reviewed - No data to display  EKG   Radiology No results found.  Procedures Procedures (including critical care time)  Medications Ordered in UC Medications - No data to display  Initial Impression / Assessment and Plan / UC Course  I have reviewed the triage vital signs and the nursing notes.  Pertinent labs & imaging results that were available during my care of the patient were reviewed by me and considered in my medical decision making (see chart for details).     Knee pain likely more inflammatory with increase in activity, no specific injury, do not suspect underlying fracture at this time.  Recommending continue anti-inflammatories rest, ice, will provide Ace wrap to provide further compression to help with any swelling.  Activity modification.  Discussed strict return precautions. Patient verbalized understanding and is agreeable with plan.  Final Clinical Impressions(s) / UC Diagnoses   Final diagnoses:  Acute pain of right knee     Discharge Instructions     Try naprosyn twice daily with food Continue to ice and elevate Ace wrap for compression/support   ED Prescriptions    Medication Sig Dispense Auth. Provider   naproxen (NAPROSYN) 500 MG tablet Take 1 tablet (500 mg total) by mouth 2 (two) times daily. 30  tablet Dossie Swor, [place] C, PA-C     PDMP not reviewed this encounter.   Brittany Dawes, PA-C 05/14/19 1013

## 2019-05-14 NOTE — Discharge Instructions (Signed)
Try naprosyn twice daily with food Continue to ice and elevate Ace wrap for compression/support

## 2019-12-04 ENCOUNTER — Encounter (HOSPITAL_COMMUNITY): Payer: Self-pay

## 2019-12-04 ENCOUNTER — Other Ambulatory Visit: Payer: Self-pay

## 2019-12-04 ENCOUNTER — Ambulatory Visit (HOSPITAL_COMMUNITY): Admission: EM | Admit: 2019-12-04 | Discharge: 2019-12-04 | Disposition: A | Discharge status: Home / Self Care

## 2019-12-04 DIAGNOSIS — T148XXA Other injury of unspecified body region, initial encounter: Secondary | ICD-10-CM

## 2019-12-04 NOTE — ED Triage Notes (Addendum)
Pt presenst to UC with lower back pain x 2 days after she pulled a muscle in her back when she was doing deadlift training in the [employer] 3 days ago.

## 2019-12-04 NOTE — ED Provider Notes (Signed)
[place]    CSN: [id number] Arrival date & time: 12/04/19  8338      History   Chief Complaint Chief Complaint  Patient presents with  . Back Pain    HPI Brittany Norton is a 24 y.o. female.   Patient is a 24 year old female presents today with mid back pain for approximate 2 days.  Symptoms have been constant.  Symptoms are worsening when bending over and stretching the back area.  Started after doing dead lift training.  Patient works in [employer], academic.  Since she has been doing ibuprofen, heat and icy hot to the area with some relief.  Denies any radiation of pain, midline tenderness, numbness, tingling or weakness in extremities.  ROS per HPI      History reviewed. No pertinent past medical history.  Patient Active Problem List   Diagnosis Date Noted  . High risk sexual behavior 11/12/2015  . Possible exposure to STD 11/12/2015  . Vaginal discharge 11/12/2015    History reviewed. No pertinent surgical history.  OB History   No obstetric history on file.      Home Medications    Prior to Admission medications   Medication Sig Start Date End Date Taking? Authorizing Provider  fluticasone (FLONASE) 50 MCG/ACT nasal spray Place 1 spray into both nostrils daily. 08/24/18   Volanda Napoleon, PA-C  ibuprofen (ADVIL,MOTRIN) 800 MG tablet Take 800 mg by mouth every 8 (eight) hours as needed.    [provider]  naproxen (NAPROSYN) 500 MG tablet Take 1 tablet (500 mg total) by mouth 2 (two) times daily. 05/14/19   Wieters, Elesa Hacker, PA-C    Family History Family History  Problem Relation Age of Onset  . Anxiety disorder Mother   . Depression Mother   . Diabetes Mother   . Hypertension Mother   . Hypertension Father   . Diabetes Maternal Grandmother     Social History Social History   Tobacco Use  . Smoking status: Former Smoker    Years: 2.00    Types: Cigarettes  . Smokeless tobacco: Never Used  Substance Use Topics  . Alcohol use:  Yes    Comment: social  . Drug use: No     Allergies   Peanut-containing drug products   Review of Systems Review of Systems   Physical Exam Triage Vital Signs ED Triage Vitals  Enc Vitals Group     BP 12/04/19 0846 109/71     Pulse Rate 12/04/19 0846 67     Resp 12/04/19 0846 16     Temp 12/04/19 0846 98.6 F (37 C)     Temp src --      SpO2 12/04/19 0846 98 %     Weight --      Height --      Head Circumference --      Peak Flow --      Pain Score 12/04/19 0844 7     Pain Loc --      Pain Edu? --      Excl. in [place]? --    No data found.  Updated Vital Signs BP 109/71 (BP Location: Right Arm)   Pulse 67   Temp 98.6 F (37 C)   Resp 16   LMP 11/09/2019 (Exact Date)   SpO2 98%   Visual Acuity Right Eye Distance:   Left Eye Distance:   Bilateral Distance:    Right Eye Near:   Left Eye Near:    Bilateral  Near:     Physical Exam Vitals and nursing note reviewed.  Constitutional:      General: She is not in acute distress.    Appearance: Normal appearance. She is not ill-appearing, toxic-appearing or diaphoretic.  HENT:     Head: Normocephalic.     Nose: Nose normal.  Eyes:     Conjunctiva/sclera: Conjunctivae normal.  Pulmonary:     Effort: Pulmonary effort is normal.  Musculoskeletal:        General: Normal range of motion.     Cervical back: Normal range of motion.       Back:     Comments: Mildly tender to palpation. No midline tenderness.  Normal range of motion  Skin:    General: Skin is warm and dry.     Findings: No rash.  Neurological:     Mental Status: She is alert.  Psychiatric:        Mood and Affect: Mood normal.      UC Treatments / Results  Labs (all labs ordered are listed, but only abnormal results are displayed) Labs Reviewed - No data to display  EKG   Radiology No results found.  Procedures Procedures (including critical care time)  Medications Ordered in UC Medications - No data to display  Initial  Impression / Assessment and Plan / UC Course  I have reviewed the triage vital signs and the nursing notes.  Pertinent labs & imaging results that were available during my care of the patient were reviewed by me and considered in my medical decision making (see chart for details).     Muscle strain Recommended keep doing heat to the area gentle stretching, ibuprofen or Aleve as needed.  No heavy lifting x1 week. Follow up as needed for continued or worsening symptoms  Final Clinical Impressions(s) / UC Diagnoses   Final diagnoses:  Muscle strain     Discharge Instructions     Keep doing heat to the area, gentle stretching.  Ibuprofen or Aleve as needed. No heavy lifting x1 week Follow up as needed for continued or worsening symptoms     ED Prescriptions    None     PDMP not reviewed this encounter.   Janace Aris, NP 12/04/19 1034

## 2019-12-04 NOTE — Discharge Instructions (Signed)
Keep doing heat to the area, gentle stretching.  Ibuprofen or Aleve as needed. No heavy lifting x1 week Follow up as needed for continued or worsening symptoms

## 2019-12-20 ENCOUNTER — Ambulatory Visit: Attending: Family

## 2019-12-20 DIAGNOSIS — Z23 Encounter for immunization: Secondary | ICD-10-CM

## 2019-12-20 NOTE — Progress Notes (Signed)
   Covid-[facility] Vaccination [facility]  Name:  Brittany Norton    MRN: [id number] DOB: 14-May-1996  12/20/2019  Brittany Norton was observed post Covid-19 immunization for 15 minutes without incident. She was provided with Vaccine Information Sheet and instruction to access the V-Safe system.   Brittany Norton was instructed to call 911 with any severe reactions post vaccine: Marland Kitchen Difficulty breathing  . Swelling of face and throat  . A fast heartbeat  . A bad rash all over body  . Dizziness and weakness   Immunizations Administered    Name Date Dose VIS Date Route   Moderna COVID-19 Vaccine 12/20/2019  3:41 PM 0.5 mL 07/2019 Intramuscular   Manufacturer: Moderna   Lot: [id number]   NDC: 77034-035-24

## 2020-01-22 ENCOUNTER — Ambulatory Visit
